# Patient Record
Sex: Female | Born: 1950 | Race: White | Hispanic: No | Marital: Single | Smoking: Former smoker
Health system: Southern US, Community
[De-identification: ages and names within clinical notes are randomized; demographics above are authoritative.]

## PROBLEM LIST (undated history)

## (undated) DIAGNOSIS — Z8619 Personal history of other infectious and parasitic diseases: Secondary | ICD-10-CM

## (undated) HISTORY — DX: Personal history of other infectious and parasitic diseases: Z86.19

---

## 2017-03-23 DIAGNOSIS — M5136 Other intervertebral disc degeneration, lumbar region: Secondary | ICD-10-CM | POA: Diagnosis not present

## 2017-03-23 DIAGNOSIS — M9903 Segmental and somatic dysfunction of lumbar region: Secondary | ICD-10-CM | POA: Diagnosis not present

## 2017-03-23 DIAGNOSIS — M9904 Segmental and somatic dysfunction of sacral region: Secondary | ICD-10-CM | POA: Diagnosis not present

## 2017-03-23 DIAGNOSIS — M545 Low back pain: Secondary | ICD-10-CM | POA: Diagnosis not present

## 2017-03-24 DIAGNOSIS — M9904 Segmental and somatic dysfunction of sacral region: Secondary | ICD-10-CM | POA: Diagnosis not present

## 2017-03-24 DIAGNOSIS — M545 Low back pain: Secondary | ICD-10-CM | POA: Diagnosis not present

## 2017-03-24 DIAGNOSIS — M5136 Other intervertebral disc degeneration, lumbar region: Secondary | ICD-10-CM | POA: Diagnosis not present

## 2017-03-24 DIAGNOSIS — M9903 Segmental and somatic dysfunction of lumbar region: Secondary | ICD-10-CM | POA: Diagnosis not present

## 2017-03-25 DIAGNOSIS — M545 Low back pain: Secondary | ICD-10-CM | POA: Diagnosis not present

## 2017-03-25 DIAGNOSIS — M9903 Segmental and somatic dysfunction of lumbar region: Secondary | ICD-10-CM | POA: Diagnosis not present

## 2017-03-25 DIAGNOSIS — M9904 Segmental and somatic dysfunction of sacral region: Secondary | ICD-10-CM | POA: Diagnosis not present

## 2017-03-25 DIAGNOSIS — M5136 Other intervertebral disc degeneration, lumbar region: Secondary | ICD-10-CM | POA: Diagnosis not present

## 2017-03-30 DIAGNOSIS — M5136 Other intervertebral disc degeneration, lumbar region: Secondary | ICD-10-CM | POA: Diagnosis not present

## 2017-03-30 DIAGNOSIS — M9904 Segmental and somatic dysfunction of sacral region: Secondary | ICD-10-CM | POA: Diagnosis not present

## 2017-03-30 DIAGNOSIS — M9903 Segmental and somatic dysfunction of lumbar region: Secondary | ICD-10-CM | POA: Diagnosis not present

## 2017-03-30 DIAGNOSIS — M545 Low back pain: Secondary | ICD-10-CM | POA: Diagnosis not present

## 2017-03-31 DIAGNOSIS — M5136 Other intervertebral disc degeneration, lumbar region: Secondary | ICD-10-CM | POA: Diagnosis not present

## 2017-03-31 DIAGNOSIS — M545 Low back pain: Secondary | ICD-10-CM | POA: Diagnosis not present

## 2017-03-31 DIAGNOSIS — M9904 Segmental and somatic dysfunction of sacral region: Secondary | ICD-10-CM | POA: Diagnosis not present

## 2017-03-31 DIAGNOSIS — M9903 Segmental and somatic dysfunction of lumbar region: Secondary | ICD-10-CM | POA: Diagnosis not present

## 2017-04-01 DIAGNOSIS — M9903 Segmental and somatic dysfunction of lumbar region: Secondary | ICD-10-CM | POA: Diagnosis not present

## 2017-04-01 DIAGNOSIS — M5136 Other intervertebral disc degeneration, lumbar region: Secondary | ICD-10-CM | POA: Diagnosis not present

## 2017-04-01 DIAGNOSIS — M545 Low back pain: Secondary | ICD-10-CM | POA: Diagnosis not present

## 2017-04-01 DIAGNOSIS — M9904 Segmental and somatic dysfunction of sacral region: Secondary | ICD-10-CM | POA: Diagnosis not present

## 2017-04-06 DIAGNOSIS — M9904 Segmental and somatic dysfunction of sacral region: Secondary | ICD-10-CM | POA: Diagnosis not present

## 2017-04-06 DIAGNOSIS — M545 Low back pain: Secondary | ICD-10-CM | POA: Diagnosis not present

## 2017-04-06 DIAGNOSIS — M5136 Other intervertebral disc degeneration, lumbar region: Secondary | ICD-10-CM | POA: Diagnosis not present

## 2017-04-06 DIAGNOSIS — M9903 Segmental and somatic dysfunction of lumbar region: Secondary | ICD-10-CM | POA: Diagnosis not present

## 2017-04-08 DIAGNOSIS — M9903 Segmental and somatic dysfunction of lumbar region: Secondary | ICD-10-CM | POA: Diagnosis not present

## 2017-04-08 DIAGNOSIS — M5136 Other intervertebral disc degeneration, lumbar region: Secondary | ICD-10-CM | POA: Diagnosis not present

## 2017-04-08 DIAGNOSIS — M9904 Segmental and somatic dysfunction of sacral region: Secondary | ICD-10-CM | POA: Diagnosis not present

## 2017-04-08 DIAGNOSIS — M545 Low back pain: Secondary | ICD-10-CM | POA: Diagnosis not present

## 2017-04-12 DIAGNOSIS — M9903 Segmental and somatic dysfunction of lumbar region: Secondary | ICD-10-CM | POA: Diagnosis not present

## 2017-04-12 DIAGNOSIS — M5136 Other intervertebral disc degeneration, lumbar region: Secondary | ICD-10-CM | POA: Diagnosis not present

## 2017-04-12 DIAGNOSIS — M9904 Segmental and somatic dysfunction of sacral region: Secondary | ICD-10-CM | POA: Diagnosis not present

## 2017-04-12 DIAGNOSIS — M545 Low back pain: Secondary | ICD-10-CM | POA: Diagnosis not present

## 2017-04-13 DIAGNOSIS — M9904 Segmental and somatic dysfunction of sacral region: Secondary | ICD-10-CM | POA: Diagnosis not present

## 2017-04-13 DIAGNOSIS — M545 Low back pain: Secondary | ICD-10-CM | POA: Diagnosis not present

## 2017-04-13 DIAGNOSIS — M9903 Segmental and somatic dysfunction of lumbar region: Secondary | ICD-10-CM | POA: Diagnosis not present

## 2017-04-13 DIAGNOSIS — M5136 Other intervertebral disc degeneration, lumbar region: Secondary | ICD-10-CM | POA: Diagnosis not present

## 2017-04-20 DIAGNOSIS — M545 Low back pain: Secondary | ICD-10-CM | POA: Diagnosis not present

## 2017-04-20 DIAGNOSIS — M9903 Segmental and somatic dysfunction of lumbar region: Secondary | ICD-10-CM | POA: Diagnosis not present

## 2017-04-20 DIAGNOSIS — M5136 Other intervertebral disc degeneration, lumbar region: Secondary | ICD-10-CM | POA: Diagnosis not present

## 2017-04-20 DIAGNOSIS — M9904 Segmental and somatic dysfunction of sacral region: Secondary | ICD-10-CM | POA: Diagnosis not present

## 2017-07-07 DIAGNOSIS — M1611 Unilateral primary osteoarthritis, right hip: Secondary | ICD-10-CM | POA: Diagnosis not present

## 2017-07-07 DIAGNOSIS — M1612 Unilateral primary osteoarthritis, left hip: Secondary | ICD-10-CM | POA: Diagnosis not present

## 2017-07-08 DIAGNOSIS — M1611 Unilateral primary osteoarthritis, right hip: Secondary | ICD-10-CM | POA: Diagnosis not present

## 2017-07-08 DIAGNOSIS — M25551 Pain in right hip: Secondary | ICD-10-CM | POA: Diagnosis not present

## 2017-07-08 DIAGNOSIS — M1612 Unilateral primary osteoarthritis, left hip: Secondary | ICD-10-CM | POA: Diagnosis not present

## 2017-07-08 DIAGNOSIS — M25552 Pain in left hip: Secondary | ICD-10-CM | POA: Diagnosis not present

## 2017-07-11 DIAGNOSIS — M25551 Pain in right hip: Secondary | ICD-10-CM | POA: Diagnosis not present

## 2017-07-11 DIAGNOSIS — M25552 Pain in left hip: Secondary | ICD-10-CM | POA: Diagnosis not present

## 2017-07-11 DIAGNOSIS — M1611 Unilateral primary osteoarthritis, right hip: Secondary | ICD-10-CM | POA: Diagnosis not present

## 2017-07-11 DIAGNOSIS — M1612 Unilateral primary osteoarthritis, left hip: Secondary | ICD-10-CM | POA: Diagnosis not present

## 2017-07-14 DIAGNOSIS — M25551 Pain in right hip: Secondary | ICD-10-CM | POA: Diagnosis not present

## 2017-07-14 DIAGNOSIS — M1612 Unilateral primary osteoarthritis, left hip: Secondary | ICD-10-CM | POA: Diagnosis not present

## 2017-07-14 DIAGNOSIS — M1611 Unilateral primary osteoarthritis, right hip: Secondary | ICD-10-CM | POA: Diagnosis not present

## 2017-07-14 DIAGNOSIS — M25552 Pain in left hip: Secondary | ICD-10-CM | POA: Diagnosis not present

## 2017-07-18 ENCOUNTER — Ambulatory Visit (INDEPENDENT_AMBULATORY_CARE_PROVIDER_SITE_OTHER): Payer: Medicare Other | Admitting: Internal Medicine

## 2017-07-18 ENCOUNTER — Other Ambulatory Visit (INDEPENDENT_AMBULATORY_CARE_PROVIDER_SITE_OTHER): Payer: Medicare Other

## 2017-07-18 ENCOUNTER — Encounter: Payer: Self-pay | Admitting: Internal Medicine

## 2017-07-18 VITALS — BP 130/90 | HR 84 | Temp 98.1°F | Resp 16 | Ht 69.0 in | Wt 161.0 lb

## 2017-07-18 DIAGNOSIS — Z1211 Encounter for screening for malignant neoplasm of colon: Secondary | ICD-10-CM

## 2017-07-18 DIAGNOSIS — R059 Cough, unspecified: Secondary | ICD-10-CM

## 2017-07-18 DIAGNOSIS — Z1159 Encounter for screening for other viral diseases: Secondary | ICD-10-CM | POA: Insufficient documentation

## 2017-07-18 DIAGNOSIS — I10 Essential (primary) hypertension: Secondary | ICD-10-CM

## 2017-07-18 DIAGNOSIS — E785 Hyperlipidemia, unspecified: Secondary | ICD-10-CM | POA: Diagnosis not present

## 2017-07-18 DIAGNOSIS — R05 Cough: Secondary | ICD-10-CM | POA: Diagnosis not present

## 2017-07-18 DIAGNOSIS — Z1231 Encounter for screening mammogram for malignant neoplasm of breast: Secondary | ICD-10-CM

## 2017-07-18 DIAGNOSIS — N6341 Unspecified lump in right breast, subareolar: Secondary | ICD-10-CM

## 2017-07-18 LAB — COMPREHENSIVE METABOLIC PANEL
ALBUMIN: 4.5 g/dL (ref 3.5–5.2)
ALT: 18 U/L (ref 0–35)
AST: 11 U/L (ref 0–37)
Alkaline Phosphatase: 102 U/L (ref 39–117)
BILIRUBIN TOTAL: 0.3 mg/dL (ref 0.2–1.2)
BUN: 19 mg/dL (ref 6–23)
CALCIUM: 10.6 mg/dL — AB (ref 8.4–10.5)
CO2: 29 mEq/L (ref 19–32)
CREATININE: 0.57 mg/dL (ref 0.40–1.20)
Chloride: 101 mEq/L (ref 96–112)
GFR: 112.31 mL/min (ref >60.00)
Glucose, Bld: 92 mg/dL (ref 70–99)
Potassium: 3.8 mEq/L (ref 3.5–5.1)
Sodium: 138 mEq/L (ref 135–145)
Total Protein: 7.7 g/dL (ref 6.0–8.3)

## 2017-07-18 LAB — LIPID PANEL
CHOLESTEROL: 293 mg/dL — AB (ref 0–200)
HDL: 60.4 mg/dL (ref >39.00)
LDL CALC: 201 mg/dL — AB (ref 0–99)
NonHDL: 232.48
TRIGLYCERIDES: 156 mg/dL — AB (ref 0.0–149.0)
Total CHOL/HDL Ratio: 5
VLDL: 31.2 mg/dL (ref 0.0–40.0)

## 2017-07-18 LAB — URINALYSIS, ROUTINE W REFLEX MICROSCOPIC
Bilirubin Urine: NEGATIVE
Ketones, ur: NEGATIVE
NITRITE: NEGATIVE
SPECIFIC GRAVITY, URINE: 1.02 (ref 1.000–1.030)
Total Protein, Urine: NEGATIVE
URINE GLUCOSE: NEGATIVE
Urobilinogen, UA: 0.2 (ref 0.0–1.0)
pH: 5.5 (ref 5.0–8.0)

## 2017-07-18 LAB — CBC WITH DIFFERENTIAL/PLATELET
BASOS PCT: 0.8 % (ref 0.0–3.0)
Basophils Absolute: 0.1 10*3/uL (ref 0.0–0.1)
EOS ABS: 0.2 10*3/uL (ref 0.0–0.7)
Eosinophils Relative: 2.5 % (ref 0.0–5.0)
HCT: 44.1 % (ref 36.0–46.0)
HEMOGLOBIN: 15.1 g/dL — AB (ref 12.0–15.0)
Lymphocytes Relative: 35.4 % (ref 12.0–46.0)
Lymphs Abs: 3.2 10*3/uL (ref 0.7–4.0)
MCHC: 34.3 g/dL (ref 30.0–36.0)
MCV: 90.6 fl (ref 78.0–100.0)
MONOS PCT: 6.3 % (ref 3.0–12.0)
Monocytes Absolute: 0.6 10*3/uL (ref 0.1–1.0)
NEUTROS ABS: 5 10*3/uL (ref 1.4–7.7)
Neutrophils Relative %: 55 % (ref 43.0–77.0)
Platelets: 329 10*3/uL (ref 150.0–400.0)
RBC: 4.87 Mil/uL (ref 3.87–5.11)
RDW: 14.1 % (ref 11.5–15.5)
WBC: 9.2 10*3/uL (ref 4.0–10.5)

## 2017-07-18 NOTE — Progress Notes (Signed)
Subjective:  Patient ID: Hailey Rodriguez, female    DOB: [birth year]  Age: 66 y.o. MRN: [id number]  CC: Cough  NEW TO ME  HPI Hailey Rodriguez presents for establishing as a new patient.  [place].  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  She has been seeing an orthopedist and is doing physical therapy.  He uses a 4 pronged walker and a cane to help with ambulation.  She also complains that she has had a mild, intermittent nonproductive cough for about 3 months.  Hailey Rodriguez has a past medical Hailey of Hailey of chicken pox.   She has no past surgical Hailey on file.   Her family Hailey is not on file.She reports that she quit smoking about 39 years ago. Her smoking use included cigarettes. she has never used smokeless tobacco. She reports that she drinks about 0.6 oz of alcohol per week. She reports that she does not use drugs.  Outpatient Medications Prior to Visit  Medication Sig Dispense Refill  . vitamin C (ASCORBIC ACID) 500 MG tablet Take 500 mg by mouth daily.     No facility-administered medications prior to visit.     ROS Review of Systems  Constitutional: Negative.  Negative for appetite change, diaphoresis, fatigue and unexpected weight change.  HENT: Negative.  Negative for sinus pressure, trouble swallowing and voice change.   Eyes: Negative.   Respiratory: Positive for cough. Negative for chest tightness, shortness of breath and wheezing.   Cardiovascular: Negative for chest pain, palpitations and leg swelling.  Gastrointestinal: Negative for abdominal pain, constipation, diarrhea, nausea and vomiting.  Endocrine: Negative.   Genitourinary: Negative.  Negative for difficulty urinating, dysuria, frequency and hematuria.  Musculoskeletal: Positive for arthralgias. Negative for back pain, myalgias and neck pain.  Skin: Negative.   Negative for color change and pallor.       She complains of a several month Hailey of red, slightly painful, growing lump around her right nipple.  She thought it was a fungal infection that she caught at the beach.  Allergic/Immunologic: Negative.   Neurological: Negative.  Negative for dizziness, weakness, light-headedness, numbness and headaches.  Hematological: Negative for adenopathy. Does not bruise/bleed easily.  Psychiatric/Behavioral: Negative.     Objective:  BP 130/90 (BP Location: Left Arm, Patient Position: Sitting, Cuff Size: Normal)   Pulse 84   Temp 98.1 F (36.7 C) (Oral)   Resp 16   Wt 161 lb (73 kg)   HC 16" (40.6 cm)   SpO2 98%   Physical Exam  Constitutional: She is oriented to person, place, and time. No distress.  HENT:  Mouth/Throat: Oropharynx is clear and moist. No oropharyngeal exudate.  Eyes: Conjunctivae are normal. Left eye exhibits no discharge. No scleral icterus.  Neck: Normal range of motion. Neck supple. No JVD present. No thyromegaly present.  Cardiovascular: Normal rate, regular rhythm and normal heart sounds. Exam reveals no gallop and no friction rub.  No murmur heard. Pulmonary/Chest: Effort normal and breath sounds normal. No stridor. No respiratory distress. She has no wheezes. She has no rales.    Abdominal: Soft. Bowel sounds are normal. She exhibits no distension and no mass. There is no tenderness. There is no guarding.  Musculoskeletal: Normal range of motion. She exhibits no edema, tenderness or deformity.  Lymphadenopathy:    She has no cervical adenopathy.  Neurological: She is alert and oriented to person, place, and time.  Skin: Skin is warm and dry. No rash noted. She is not diaphoretic. No erythema. No pallor.  Psychiatric: She has a normal mood and affect. Her behavior is normal. Judgment and thought content normal.  Vitals reviewed.   Lab Results  Component Value Date   WBC 9.2 07/18/2017   HGB 15.1 (H) 07/18/2017    HCT 44.1 07/18/2017   PLT 329.0 07/18/2017   GLUCOSE 92 07/18/2017   CHOL 293 (H) 07/18/2017   TRIG 156.0 (H) 07/18/2017   HDL 60.40 07/18/2017   LDLCALC 201 (H) 07/18/2017   ALT 18 07/18/2017   AST 11 07/18/2017   NA 138 07/18/2017   K 3.8 07/18/2017   CL 101 07/18/2017   CREATININE 0.57 07/18/2017   BUN 19 07/18/2017   CO2 29 07/18/2017   TSH 0.85 07/18/2017    Assessment & Plan:   Hailey Rodriguez was seen today for cough.  Diagnoses and all orders for this visit:  Essential hypertension- She has stage I hypertension but defers on starting an antihypertensive at this time.  Her lab work is only remarkable for mild hypercalcemia otherwise there is no evidence of secondary causes or endorgan damage. -     Comprehensive metabolic panel; Future -     CBC with Differential/Platelet; Future -     Urinalysis, Routine w reflex microscopic; Future  Hyperlipidemia LDL goal <130- She has a mildly elevated LDL but her ASCVD risk score is not significantly elevated so at this time I do not recommend a statin for CV risk reduction. -     Comprehensive metabolic panel; Future -     Lipid panel; Future -     Thyroid Panel With TSH; Future  Visit for screening mammogram -     MM DIGITAL SCREENING BILATERAL; Future  Screen for colon cancer -     Ambulatory referral to Gastroenterology  Need for hepatitis C screening test -     Hepatitis C antibody; Future  Subareolar mass of right breast- I have asked her to see general surgery to consider having this biopsied. -     Ambulatory referral to General Surgery  Cough- I will check her chest x-ray for mass or infiltrate. -     DG Chest 2 View; Future   I am having Hailey Rodriguez maintain her vitamin C.  No orders of the defined types were placed in this encounter.    Follow-up: Return in about 3 months (around 10/15/2017).  Scarlette Calico, MD

## 2017-07-18 NOTE — Patient Instructions (Signed)

## 2017-07-19 ENCOUNTER — Encounter: Payer: Self-pay | Admitting: Internal Medicine

## 2017-07-19 ENCOUNTER — Telehealth: Payer: Self-pay | Admitting: Internal Medicine

## 2017-07-19 ENCOUNTER — Telehealth: Payer: Self-pay

## 2017-07-19 ENCOUNTER — Ambulatory Visit (INDEPENDENT_AMBULATORY_CARE_PROVIDER_SITE_OTHER)
Admission: RE | Admit: 2017-07-19 | Discharge: 2017-07-19 | Disposition: A | Discharge status: Home / Self Care | Payer: Medicare Other | Source: Ambulatory Visit | Attending: Internal Medicine | Admitting: Internal Medicine

## 2017-07-19 DIAGNOSIS — N6341 Unspecified lump in right breast, subareolar: Secondary | ICD-10-CM | POA: Insufficient documentation

## 2017-07-19 DIAGNOSIS — R059 Cough, unspecified: Secondary | ICD-10-CM

## 2017-07-19 DIAGNOSIS — M25552 Pain in left hip: Secondary | ICD-10-CM | POA: Diagnosis not present

## 2017-07-19 DIAGNOSIS — M1612 Unilateral primary osteoarthritis, left hip: Secondary | ICD-10-CM | POA: Diagnosis not present

## 2017-07-19 DIAGNOSIS — R05 Cough: Secondary | ICD-10-CM

## 2017-07-19 DIAGNOSIS — E2839 Other primary ovarian failure: Secondary | ICD-10-CM

## 2017-07-19 DIAGNOSIS — M25551 Pain in right hip: Secondary | ICD-10-CM | POA: Diagnosis not present

## 2017-07-19 DIAGNOSIS — M1611 Unilateral primary osteoarthritis, right hip: Secondary | ICD-10-CM | POA: Diagnosis not present

## 2017-07-19 LAB — HEPATITIS C ANTIBODY
Hepatitis C Ab: NONREACTIVE
SIGNAL TO CUT-OFF: 0.01 (ref <1.00)

## 2017-07-19 LAB — THYROID PANEL WITH TSH
FREE THYROXINE INDEX: 2.6 (ref 1.4–3.8)
T3 UPTAKE: 27 % (ref 22–35)
T4 TOTAL: 9.6 ug/dL (ref 5.1–11.9)
TSH: 0.85 mIU/L (ref 0.40–4.50)

## 2017-07-19 LAB — HM DEXA SCAN: HM DEXA SCAN: -2.4

## 2017-07-19 NOTE — Telephone Encounter (Signed)
Error

## 2017-07-19 NOTE — Telephone Encounter (Signed)
Copied from [place]. Topic: General - Other >> Jul 19, 2017  4:37 PM Cecelia Byars, NT wrote: Reason for CRM: Patient called and would like  penicillin in the liquid form called in to [place], [facility] [place]. [phone] (Phone) [phone] (Fax)

## 2017-07-21 ENCOUNTER — Ambulatory Visit: Payer: Self-pay | Admitting: *Deleted

## 2017-07-21 DIAGNOSIS — M1612 Unilateral primary osteoarthritis, left hip: Secondary | ICD-10-CM | POA: Diagnosis not present

## 2017-07-21 DIAGNOSIS — M1611 Unilateral primary osteoarthritis, right hip: Secondary | ICD-10-CM | POA: Diagnosis not present

## 2017-07-21 DIAGNOSIS — M25551 Pain in right hip: Secondary | ICD-10-CM | POA: Diagnosis not present

## 2017-07-21 DIAGNOSIS — M25552 Pain in left hip: Secondary | ICD-10-CM | POA: Diagnosis not present

## 2017-07-21 NOTE — Telephone Encounter (Signed)
Pt had a question regarding needing penicillin for her chronic bronchitis. Pt denies any respiratory symptoms. Denies fever. Has mild cough.  Pt was triaged. She wants to know if she needs liquid  penicillin . Advised to call back if symptoms occur of she feels worst. Pt voiced understanding.

## 2017-07-21 NOTE — Telephone Encounter (Signed)
Pt had a question regarding needing penicillin for her chronic bronchitis. Pt denies any respiratory symptoms. Denies fever. Has mild cough.  Will send message to office, per protocol. Advised to call back if symptoms occur of she feels worst. Pt voiced understanding.  Reason for Disposition . Caller requesting a NON-URGENT new prescription or refill and triager unable to refill per unit policy  Answer Assessment - Initial Assessment Questions 1. SYMPTOMS: "Do you have any symptoms?"     Cough 2. SEVERITY: If symptoms are present, ask "Are they mild, moderate or severe?"     mild  Protocols used: MEDICATION QUESTION CALL-A-AH

## 2017-07-21 NOTE — Telephone Encounter (Signed)
Called patient to see what symptoms she maybe having to clarify the request for liquid penicillin. No answer. Left message to call us back.

## 2017-07-25 DIAGNOSIS — M1612 Unilateral primary osteoarthritis, left hip: Secondary | ICD-10-CM | POA: Diagnosis not present

## 2017-07-25 DIAGNOSIS — M25552 Pain in left hip: Secondary | ICD-10-CM | POA: Diagnosis not present

## 2017-07-25 DIAGNOSIS — M25551 Pain in right hip: Secondary | ICD-10-CM | POA: Diagnosis not present

## 2017-07-25 DIAGNOSIS — M1611 Unilateral primary osteoarthritis, right hip: Secondary | ICD-10-CM | POA: Diagnosis not present

## 2017-07-28 DIAGNOSIS — M1612 Unilateral primary osteoarthritis, left hip: Secondary | ICD-10-CM | POA: Diagnosis not present

## 2017-07-28 DIAGNOSIS — M25551 Pain in right hip: Secondary | ICD-10-CM | POA: Diagnosis not present

## 2017-07-28 DIAGNOSIS — M1611 Unilateral primary osteoarthritis, right hip: Secondary | ICD-10-CM | POA: Diagnosis not present

## 2017-07-28 DIAGNOSIS — M25552 Pain in left hip: Secondary | ICD-10-CM | POA: Diagnosis not present

## 2017-08-01 DIAGNOSIS — M1612 Unilateral primary osteoarthritis, left hip: Secondary | ICD-10-CM | POA: Diagnosis not present

## 2017-08-01 DIAGNOSIS — M25552 Pain in left hip: Secondary | ICD-10-CM | POA: Diagnosis not present

## 2017-08-01 DIAGNOSIS — M1611 Unilateral primary osteoarthritis, right hip: Secondary | ICD-10-CM | POA: Diagnosis not present

## 2017-08-01 DIAGNOSIS — M25551 Pain in right hip: Secondary | ICD-10-CM | POA: Diagnosis not present

## 2017-08-04 DIAGNOSIS — M1611 Unilateral primary osteoarthritis, right hip: Secondary | ICD-10-CM | POA: Diagnosis not present

## 2017-08-04 DIAGNOSIS — M1612 Unilateral primary osteoarthritis, left hip: Secondary | ICD-10-CM | POA: Diagnosis not present

## 2017-08-04 DIAGNOSIS — M25551 Pain in right hip: Secondary | ICD-10-CM | POA: Diagnosis not present

## 2017-08-04 DIAGNOSIS — M25552 Pain in left hip: Secondary | ICD-10-CM | POA: Diagnosis not present

## 2017-08-16 ENCOUNTER — Encounter: Payer: Self-pay | Admitting: Internal Medicine

## 2017-08-26 DIAGNOSIS — M1611 Unilateral primary osteoarthritis, right hip: Secondary | ICD-10-CM | POA: Diagnosis not present

## 2017-08-26 DIAGNOSIS — M25552 Pain in left hip: Secondary | ICD-10-CM | POA: Diagnosis not present

## 2017-08-26 DIAGNOSIS — M25551 Pain in right hip: Secondary | ICD-10-CM | POA: Diagnosis not present

## 2017-08-26 DIAGNOSIS — M1612 Unilateral primary osteoarthritis, left hip: Secondary | ICD-10-CM | POA: Diagnosis not present

## 2017-10-18 ENCOUNTER — Other Ambulatory Visit (INDEPENDENT_AMBULATORY_CARE_PROVIDER_SITE_OTHER): Payer: Medicare Other

## 2017-10-18 ENCOUNTER — Encounter: Payer: Self-pay | Admitting: Internal Medicine

## 2017-10-18 ENCOUNTER — Ambulatory Visit (INDEPENDENT_AMBULATORY_CARE_PROVIDER_SITE_OTHER): Payer: Medicare Other | Admitting: Internal Medicine

## 2017-10-18 VITALS — BP 138/96 | HR 79 | Temp 99.0°F | Resp 16 | Ht 69.0 in | Wt 153.5 lb

## 2017-10-18 DIAGNOSIS — Z23 Encounter for immunization: Secondary | ICD-10-CM | POA: Diagnosis not present

## 2017-10-18 DIAGNOSIS — I10 Essential (primary) hypertension: Secondary | ICD-10-CM | POA: Diagnosis not present

## 2017-10-18 DIAGNOSIS — N6341 Unspecified lump in right breast, subareolar: Secondary | ICD-10-CM

## 2017-10-18 DIAGNOSIS — E559 Vitamin D deficiency, unspecified: Secondary | ICD-10-CM

## 2017-10-18 LAB — VITAMIN D 25 HYDROXY (VIT D DEFICIENCY, FRACTURES): VITD: 28.51 ng/mL — ABNORMAL LOW (ref 30.00–100.00)

## 2017-10-18 NOTE — Progress Notes (Signed)
Subjective:  Patient ID: Hailey Rodriguez, female    DOB: Oct 03, 1950  Age: 67 y.o. MRN: [id number]  CC: Hypertension   HPI Hailey Rodriguez presents for f/up - She has not seen a surgeon about the right breast mass.  Said she has been sick with a cold for a couple of weeks.  She tells me her cold symptoms have resolved.  She feels well today and offers no complaints.  Outpatient Medications Prior to Visit  Medication Sig Dispense Refill  . vitamin C (ASCORBIC ACID) 500 MG tablet Take 500 mg by mouth daily.     No facility-administered medications prior to visit.     ROS Review of Systems  Constitutional: Negative for chills, fatigue and fever.  HENT: Negative.  Negative for sinus pressure and sore throat.   Eyes: Negative for visual disturbance.  Respiratory: Negative for cough, chest tightness, shortness of breath and wheezing.   Cardiovascular: Negative for chest pain, palpitations and leg swelling.  Gastrointestinal: Negative for abdominal pain, constipation, diarrhea, nausea and vomiting.  Endocrine: Negative.   Genitourinary: Negative.  Negative for difficulty urinating.  Musculoskeletal: Negative.  Negative for arthralgias and myalgias.  Skin: Negative.  Negative for color change, pallor and rash.  Allergic/Immunologic: Negative.   Neurological: Negative.  Negative for dizziness, weakness and headaches.  Hematological: Negative for adenopathy. Does not bruise/bleed easily.  Psychiatric/Behavioral: Negative.  Negative for sleep disturbance. The patient is not nervous/anxious.     Objective:  BP (!) 138/96 (BP Location: Left Arm, Patient Position: Sitting, Cuff Size: Normal)   Pulse 79   Temp 99 F (37.2 C) (Oral)   Resp 16   Ht 5\' 9"  (1.753 m)   Wt 153 lb 8 oz (69.6 kg)   SpO2 96%   BMI 22.67 kg/m   BP Readings from Last 3 Encounters:  10/18/17 (!) 138/96  07/18/17 130/90    Wt Readings from Last 3 Encounters:  10/18/17 153 lb 8 oz (69.6 kg)  07/18/17 161 lb (73  kg)    Physical Exam  Constitutional: She is oriented to person, place, and time. No distress.  HENT:  Mouth/Throat: Oropharynx is clear and moist. No oropharyngeal exudate.  Eyes: Conjunctivae are normal. No scleral icterus.  Neck: Normal range of motion. Neck supple. No JVD present. No tracheal deviation present. No thyromegaly present.  Cardiovascular: Normal rate and regular rhythm. Exam reveals no friction rub.  No murmur heard. Pulmonary/Chest: Effort normal and breath sounds normal. She has no wheezes. She has no rales.  Abdominal: Soft. Bowel sounds are normal. She exhibits no mass. There is no hepatosplenomegaly. There is no tenderness.  Musculoskeletal: Normal range of motion. She exhibits no edema, tenderness or deformity.  Lymphadenopathy:    She has no cervical adenopathy.  Neurological: She is alert and oriented to person, place, and time.  Skin: Skin is warm and dry. She is not diaphoretic.  Psychiatric: She has a normal mood and affect. Her behavior is normal. Thought content normal.  Vitals reviewed.   Lab Results  Component Value Date   WBC 9.2 07/18/2017   HGB 15.1 (H) 07/18/2017   HCT 44.1 07/18/2017   PLT 329.0 07/18/2017   GLUCOSE 92 07/18/2017   CHOL 293 (H) 07/18/2017   TRIG 156.0 (H) 07/18/2017   HDL 60.40 07/18/2017   LDLCALC 201 (H) 07/18/2017   ALT 18 07/18/2017   AST 11 07/18/2017   NA 138 07/18/2017   K 3.8 07/18/2017   CL 101 07/18/2017  CREATININE 0.57 07/18/2017   BUN 19 07/18/2017   CO2 29 07/18/2017   TSH 0.85 07/18/2017    Dg Chest 2 View  Result Date: 07/19/2017 CLINICAL DATA:  Chronic cough, former smoker, hyperlipidemia. Routine physical examination. EXAM: CHEST  2 VIEW COMPARISON:  None in PACs FINDINGS: The lungs are mildly hyperinflated with hemidiaphragm flattening. There is no focal infiltrate. There is no pleural effusion. The heart and pulmonary vascularity are normal. The mediastinum is normal in width. The trachea is  midline. The bony thorax is unremarkable. IMPRESSION: Chronic bronchitic changes. There is no active cardiopulmonary disease. Electronically Signed   By: David  Martinique M.D.   On: 07/19/2017 09:16   Dg Bone Density  Result Date: 07/19/2017 Date of study: 07/19/2017 Exam: DUAL X-RAY ABSORPTIOMETRY (DXA) FOR BONE MINERAL DENSITY (BMD) Instrument: [employer] Provider: PCP Indication: Screening for low BMD in a patient with Legg-Calv-Perthes disease Comparison: none (please note that it is not possible to compare data from different instruments) Clinical data: Pt is a 67 y.o. female with previous history of foot fracture. Results:  Lumbar spine L1-L4 Femoral neck (FN) 33% distal radius Ultra distal radius T-score -1.4 RFN: +0.6 LFN: -0.1  -1.8  -2.4 Assessment: the BMD is low according to the [facility] classification for osteoporosis (see below). Fracture risk: moderate Comments: the technical quality of the study is good, however, the hips are abnormally rotated, which can greatly influence the bone mineral density readings. These readings should not not be used in this patient with Legg-Calv-Perthes disease.  The 33% distal radius can be used as a surrogate for cortical bone, and the ultra distal radius can be used as a surrogate for trabecular bone. Evaluation for secondary causes should be considered if clinically indicated. Recommend optimizing calcium (1200 mg/day) and vitamin D (800 IU/day) intake. Followup: Repeat BMD is appropriate after 2 years or after 1-2 years if starting treatment. WHO criteria for diagnosis of osteoporosis in postmenopausal women and in men 9 y/o or older: - normal: T-score -1.0 to + 1.0 - osteopenia/low bone density: T-score between -2.5 and -1.0 - osteoporosis: T-score below -2.5 - severe osteoporosis: T-score below -2.5 with history of fragility fracture Note: although not part of the WHO classification, the presence of a fragility fracture, regardless of the  T-score, should be considered diagnostic of osteoporosis, provided other causes for the fracture have been excluded. Treatment: The National Osteoporosis Foundation recommends that treatment be considered in postmenopausal women and men age 66 or older with: 1. Hip or vertebral (clinical or morphometric) fracture 2. T-score of - 2.5 or lower at the spine or hip 3. 10-year fracture probability by FRAX of at least 20% for a major osteoporotic fracture and 3% for a hip fracture Philemon Kingdom, MD [facility]    Assessment & Plan:   Hailey Rodriguez was seen today for hypertension.  Diagnoses and all orders for this visit:  Essential hypertension- Her blood pressure is not adequately well controlled.  She is not willing to take an antihypertensive.  I will treat the vitamin D deficiency. -     VITAMIN D 25 Hydroxy (Vit-D Deficiency, Fractures); Future  Hypercalcemia- Her calcium level is normal now.  Her PTH is mildly elevated at 70.  She may be developing a secondary hyperparathyroidism due to the vitamin D deficiency so I have asked her to start taking a vitamin D supplement. -     VITAMIN D 25 Hydroxy (Vit-D Deficiency, Fractures); Future -     PTH,  intact and calcium; Future  Subareolar mass of right breast -     Ambulatory referral to General Surgery  Need for Tdap vaccination -     Tdap vaccine greater than or equal to 7yo IM  Need for pneumococcal vaccination -     Pneumococcal conjugate vaccine 13-valent  Vitamin D deficiency -     Cholecalciferol 2000 units TABS; Take 1 tablet (2,000 Units total) by mouth daily.   I am having Kieu L. Chatwin start on Cholecalciferol. I am also having her maintain her vitamin C.  Meds ordered this encounter  Medications  . Cholecalciferol 2000 units TABS    Sig: Take 1 tablet (2,000 Units total) by mouth daily.    Dispense:  90 tablet    Refill:  1     Follow-up: Return in about 3 months (around 01/18/2018).  Scarlette Calico, MD

## 2017-10-18 NOTE — Patient Instructions (Signed)

## 2017-10-20 DIAGNOSIS — E559 Vitamin D deficiency, unspecified: Secondary | ICD-10-CM | POA: Insufficient documentation

## 2017-10-20 DIAGNOSIS — Z23 Encounter for immunization: Secondary | ICD-10-CM | POA: Insufficient documentation

## 2017-10-20 LAB — PTH, INTACT AND CALCIUM
Calcium: 9.9 mg/dL (ref 8.6–10.4)
PTH: 70 pg/mL — ABNORMAL HIGH (ref 14–64)

## 2017-10-20 MED ORDER — CHOLECALCIFEROL 50 MCG (2000 UT) PO TABS: 1.0000 | ORAL_TABLET | Freq: Every day | ORAL | 1 refills | Status: AC

## 2018-01-18 ENCOUNTER — Ambulatory Visit (INDEPENDENT_AMBULATORY_CARE_PROVIDER_SITE_OTHER): Payer: Medicare Other | Admitting: Internal Medicine

## 2018-01-18 ENCOUNTER — Encounter: Payer: Self-pay | Admitting: Internal Medicine

## 2018-01-18 VITALS — BP 130/90 | HR 75 | Temp 97.4°F | Ht 69.0 in | Wt 150.5 lb

## 2018-01-18 DIAGNOSIS — N6341 Unspecified lump in right breast, subareolar: Secondary | ICD-10-CM | POA: Diagnosis not present

## 2018-01-18 DIAGNOSIS — I10 Essential (primary) hypertension: Secondary | ICD-10-CM

## 2018-01-18 NOTE — Progress Notes (Signed)
Subjective:  Patient ID: Hailey Rodriguez, female    DOB: 03-23-1951  Age: 67 y.o. MRN: [id number]  CC: Hypertension   HPI Hailey Rodriguez presents for f/up - She continues to complain about her right breast.  When I saw her 3 months ago I referred her to general surgery but she elected not to see a surgeon yet.  She is petrified about being diagnosed with breast cancer and is concerned about the costs.  She complains that the lesion is still there and now is starting to drain blood.  She cleans it daily with hydrogen peroxide and puts a Band-Aid over it.  Outpatient Medications Prior to Visit  Medication Sig Dispense Refill  . Cholecalciferol 2000 units TABS Take 1 tablet (2,000 Units total) by mouth daily. 90 tablet 1  . Omega-3 Fatty Acids (OMEGA-3 FISH OIL PO) Take by mouth.    . vitamin C (ASCORBIC ACID) 500 MG tablet Take 500 mg by mouth daily.     No facility-administered medications prior to visit.     ROS Review of Systems  Constitutional: Negative for chills, diaphoresis and fatigue.  HENT: Negative.   Eyes: Negative for visual disturbance.  Respiratory: Negative for cough, chest tightness, shortness of breath and wheezing.   Cardiovascular: Negative for chest pain, palpitations and leg swelling.  Gastrointestinal: Negative for abdominal pain, constipation, diarrhea, nausea and vomiting.  Endocrine: Negative.   Genitourinary: Negative.  Negative for difficulty urinating.  Musculoskeletal: Negative.   Skin: Positive for color change.  Neurological: Negative.  Negative for dizziness, weakness and numbness.  Hematological: Negative for adenopathy. Does not bruise/bleed easily.  Psychiatric/Behavioral: Negative.     Objective:  BP 130/90 (BP Location: Left Arm, Patient Position: Sitting, Cuff Size: Normal)   Pulse 75   Temp (!) 97.4 F (36.3 C) (Oral)   Ht 5\' 9"  (1.753 m)   Wt 150 lb 8 oz (68.3 kg)   SpO2 96%   BMI 22.22 kg/m   BP Readings from Last 3 Encounters:    01/18/18 130/90  10/18/17 (!) 138/96  07/18/17 130/90    Wt Readings from Last 3 Encounters:  01/18/18 150 lb 8 oz (68.3 kg)  10/18/17 153 lb 8 oz (69.6 kg)  07/18/17 161 lb (73 kg)    Physical Exam  Constitutional: She is oriented to person, place, and time. No distress.  HENT:  Mouth/Throat: Oropharynx is clear and moist. No oropharyngeal exudate.  Eyes: Conjunctivae are normal. No scleral icterus.  Neck: Normal range of motion. Neck supple. No thyromegaly present.  Cardiovascular: Normal rate, regular rhythm and normal heart sounds. Exam reveals no gallop.  No murmur heard.   Pulmonary/Chest: Effort normal and breath sounds normal. No respiratory distress. She has no wheezes. She has no rhonchi. She has no rales.  Abdominal: Soft. Bowel sounds are normal. She exhibits no mass. There is no hepatosplenomegaly. There is no tenderness.  Musculoskeletal: Normal range of motion. She exhibits no edema, tenderness or deformity.  Neurological: She is alert and oriented to person, place, and time.  Skin: Skin is warm and dry. She is not diaphoretic. No pallor.  Vitals reviewed.   Lab Results  Component Value Date   WBC 9.2 07/18/2017   HGB 15.1 (H) 07/18/2017   HCT 44.1 07/18/2017   PLT 329.0 07/18/2017   GLUCOSE 92 07/18/2017   CHOL 293 (H) 07/18/2017   TRIG 156.0 (H) 07/18/2017   HDL 60.40 07/18/2017   LDLCALC 201 (H) 07/18/2017   ALT 18  07/18/2017   AST 11 07/18/2017   NA 138 07/18/2017   K 3.8 07/18/2017   CL 101 07/18/2017   CREATININE 0.57 07/18/2017   BUN 19 07/18/2017   CO2 29 07/18/2017   TSH 0.85 07/18/2017    Dg Chest 2 View  Result Date: 07/19/2017 CLINICAL DATA:  Chronic cough, former smoker, hyperlipidemia. Routine physical examination. EXAM: CHEST  2 VIEW COMPARISON:  None in PACs FINDINGS: The lungs are mildly hyperinflated with hemidiaphragm flattening. There is no focal infiltrate. There is no pleural effusion. The heart and pulmonary vascularity are  normal. The mediastinum is normal in width. The trachea is midline. The bony thorax is unremarkable. IMPRESSION: Chronic bronchitic changes. There is no active cardiopulmonary disease. Electronically Signed   By: David  Martinique M.D.   On: 07/19/2017 09:16   Dg Bone Density  Result Date: 07/19/2017 Date of study: 07/19/2017 Exam: DUAL X-RAY ABSORPTIOMETRY (DXA) FOR BONE MINERAL DENSITY (BMD) Instrument: [employer] Provider: PCP Indication: Screening for low BMD in a patient with Legg-Calv-Perthes disease Comparison: none (please note that it is not possible to compare data from different instruments) Clinical data: Pt is a 67 y.o. female with previous history of foot fracture. Results:  Lumbar spine L1-L4 Femoral neck (FN) 33% distal radius Ultra distal radius T-score -1.4 RFN: +0.6 LFN: -0.1  -1.8  -2.4 Assessment: the BMD is low according to the [facility] classification for osteoporosis (see below). Fracture risk: moderate Comments: the technical quality of the study is good, however, the hips are abnormally rotated, which can greatly influence the bone mineral density readings. These readings should not not be used in this patient with Legg-Calv-Perthes disease.  The 33% distal radius can be used as a surrogate for cortical bone, and the ultra distal radius can be used as a surrogate for trabecular bone. Evaluation for secondary causes should be considered if clinically indicated. Recommend optimizing calcium (1200 mg/day) and vitamin D (800 IU/day) intake. Followup: Repeat BMD is appropriate after 2 years or after 1-2 years if starting treatment. WHO criteria for diagnosis of osteoporosis in postmenopausal women and in men 60 y/o or older: - normal: T-score -1.0 to + 1.0 - osteopenia/low bone density: T-score between -2.5 and -1.0 - osteoporosis: T-score below -2.5 - severe osteoporosis: T-score below -2.5 with history of fragility fracture Note: although not part of the WHO classification, the  presence of a fragility fracture, regardless of the T-score, should be considered diagnostic of osteoporosis, provided other causes for the fracture have been excluded. Treatment: The National Osteoporosis Foundation recommends that treatment be considered in postmenopausal women and men age 68 or older with: 1. Hip or vertebral (clinical or morphometric) fracture 2. T-score of - 2.5 or lower at the spine or hip 3. 10-year fracture probability by FRAX of at least 20% for a major osteoporotic fracture and 3% for a hip fracture Philemon Kingdom, MD [facility]    Assessment & Plan:   Pearla was seen today for hypertension.  Diagnoses and all orders for this visit:  Subareolar mass of right breast- I have asked her to undergo a diagnostic mammogram and ultrasound.  I have also asked her to see a Psychologist, sport and exercise.  I have told her that it is imperative that she have this lesion biopsied and if its breast cancer to have it treated. -     Ambulatory referral to General Surgery -     US BREAST LTD UNI RIGHT INC AXILLA; Future -  MM DIAG BREAST TOMO BILATERAL; Future  Hypercalcemia- She is asymptomatic with this.  I will monitor her calcium level.  This is likely related to what appears to be breast cancer. -     Basic metabolic panel; Future -     Phosphorus; Future  Essential hypertension- Her blood pressure is well controlled.  I will monitor her lites and renal function. -     Basic metabolic panel; Future   I am having Hailey Rodriguez maintain her vitamin C, Cholecalciferol, and Omega-3 Fatty Acids (OMEGA-3 FISH OIL PO).  No orders of the defined types were placed in this encounter.    Follow-up: Return in about 3 months (around 04/20/2018).  Scarlette Calico, MD

## 2018-01-18 NOTE — Patient Instructions (Signed)
Hypercalcemia  Hypercalcemia is having too much calcium in the blood. The body needs calcium to make bones and keep them strong. Calcium also helps the muscles, nerves, brain, and heart work the way they should.  Most of the calcium in the body is in the bones. There is also some calcium in the blood. Hypercalcemia can happen when calcium comes out of the bones, or when the kidneys are not able to remove calcium from the blood. Hypercalcemia can be mild or severe.  What are the causes?  There are many possible causes of hypercalcemia. Common causes include:   Hyperparathyroidism. This is a condition in which the body produces too much parathyroid hormone. There are four parathyroid glands in your neck. These glands produce a chemical messenger (hormone) that helps the body absorb calcium from foods and helps your bones release calcium.   Certain kinds of cancer, such as lung cancer, breast cancer, or myeloma.    Less common causes of hypercalcemia include:   Getting too much calcium or vitamin D from your diet.   Kidney failure.   Hyperthyroidism.   Being on bed rest for a long time.   Certain medicines.   Infections.   Sarcoidosis.    What increases the risk?  This condition is more likely to develop in:   Women.   People who are 60 years or older.   People who have a family history of hypercalcemia.    What are the signs or symptoms?  Mild hypercalcemia that starts slowly may not cause symptoms. Severe, sudden hypercalcemia is more likely to cause symptoms, such as:   Loss of appetite.   Increased thirst and frequent urination.   Fatigue.   Nausea and vomiting.   Headache.   Abdominal pain.   Muscle pain, twitching, or weakness.   Constipation.   Blood in the urine.   Pain in the side of the back (flank pain).   Anxiety, confusion, or depression.   Irregular heartbeat (arrhythmia).   Loss of consciousness.    How is this diagnosed?  This condition may be diagnosed based on:   Your  symptoms.   Blood tests.   Urine tests.   X-rays.   Ultrasound.   MRI.   CT scan.    How is this treated?  Treatment for hypercalcemia depends on the cause. Treatment may include:   Receiving fluids through an IV tube.   Medicines that keep calcium levels steady after receiving fluids (loop diuretics).   Medicines that keep calcium in your bones (bisphosphonates).   Medicines that lower the calcium level in your blood.   Surgery to remove overactive parathyroid glands.    Follow these instructions at home:   Take over-the-counter and prescription medicines only as told by your health care provider.   Follow instructions from your health care provider about eating or drinking restrictions.   Drink enough fluid to keep your urine clear or pale yellow.   Stay active. Weight-bearing exercise helps to keep calcium in your bones. Follow instructions from your health care provider about what type and level of exercise is safe for you.   Keep all follow-up visits as told by your health care provider. This is important.  Contact a health care provider if:   You have a fever.   You have flank or abdominal pain that is getting worse.  Get help right away if:   You have severe abdominal or flank pain.   You have chest pain.     You have trouble breathing.   You become very confused and sleepy.   You lose consciousness.  This information is not intended to replace advice given to you by your health care provider. Make sure you discuss any questions you have with your health care provider.  Document Released: 07/24/2004 Document Revised: 10/16/2015 Document Reviewed: 09/25/2014  [employer] Interactive Patient Education  2018 [employer]

## 2018-01-20 ENCOUNTER — Other Ambulatory Visit (INDEPENDENT_AMBULATORY_CARE_PROVIDER_SITE_OTHER): Payer: Medicare Other

## 2018-01-20 ENCOUNTER — Encounter: Payer: Self-pay | Admitting: Internal Medicine

## 2018-01-20 DIAGNOSIS — I10 Essential (primary) hypertension: Secondary | ICD-10-CM | POA: Diagnosis not present

## 2018-01-20 LAB — BASIC METABOLIC PANEL
BUN: 26 mg/dL — AB (ref 6–23)
CALCIUM: 9.7 mg/dL (ref 8.4–10.5)
CO2: 27 mEq/L (ref 19–32)
Chloride: 101 mEq/L (ref 96–112)
Creatinine, Ser: 0.64 mg/dL (ref 0.40–1.20)
GFR: 98.11 mL/min (ref >60.00)
Glucose, Bld: 94 mg/dL (ref 70–99)
Potassium: 4.2 mEq/L (ref 3.5–5.1)
SODIUM: 138 meq/L (ref 135–145)

## 2018-01-20 LAB — PHOSPHORUS: PHOSPHORUS: 4 mg/dL (ref 2.3–4.6)

## 2018-01-31 ENCOUNTER — Encounter: Payer: Self-pay | Admitting: Internal Medicine

## 2018-02-02 ENCOUNTER — Encounter: Payer: Self-pay | Admitting: Internal Medicine

## 2018-04-26 ENCOUNTER — Ambulatory Visit: Payer: Medicare Other | Admitting: Internal Medicine

## 2018-05-31 ENCOUNTER — Ambulatory Visit: Payer: Medicare Other | Admitting: Internal Medicine

## 2018-06-09 IMAGING — DX DG CHEST 2V
2 series · 2 of 2 positions shown · non-contrast
Comparison: None in PACs

CLINICAL DATA: Chronic cough, former smoker, hyperlipidemia.
Routine physical examination.

EXAM:
CHEST  2 VIEW

[chest pa]
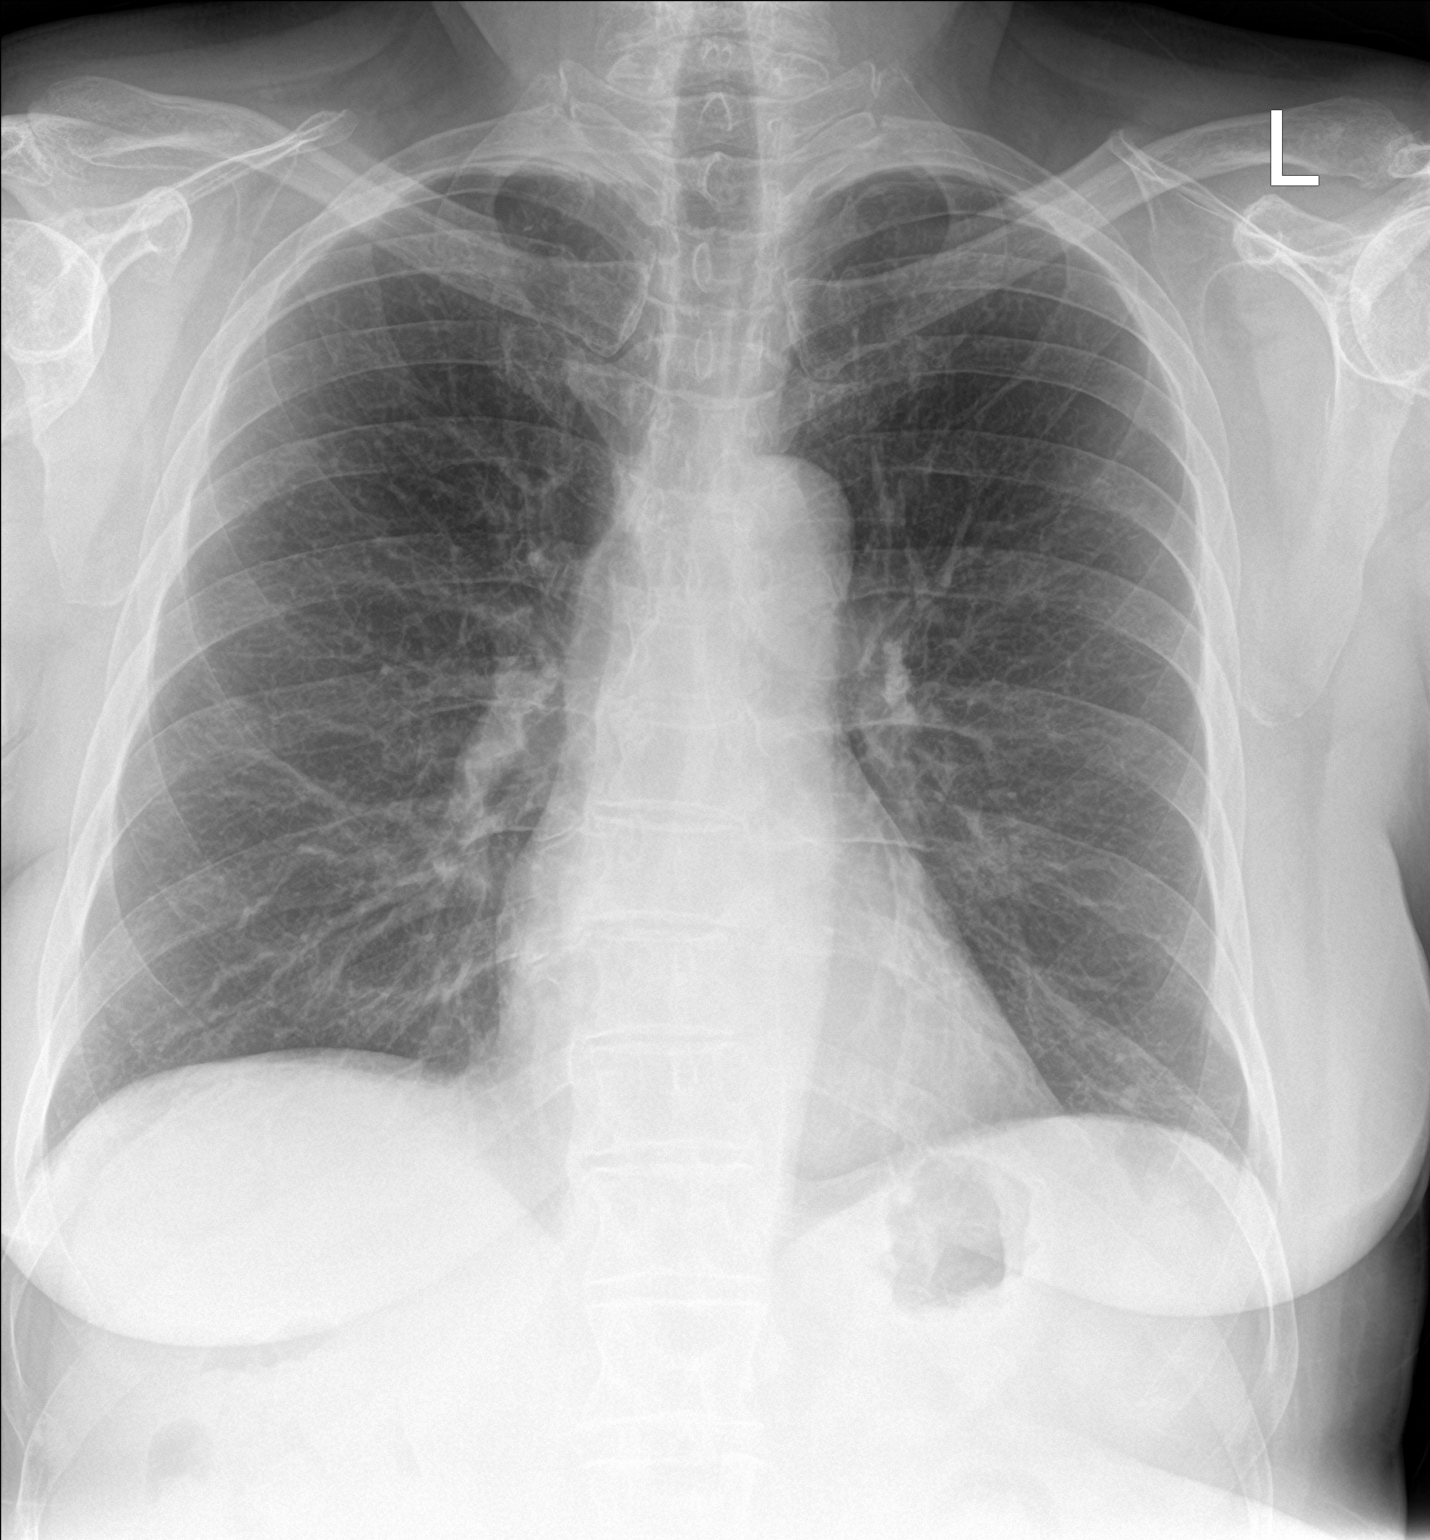

[chest lat]
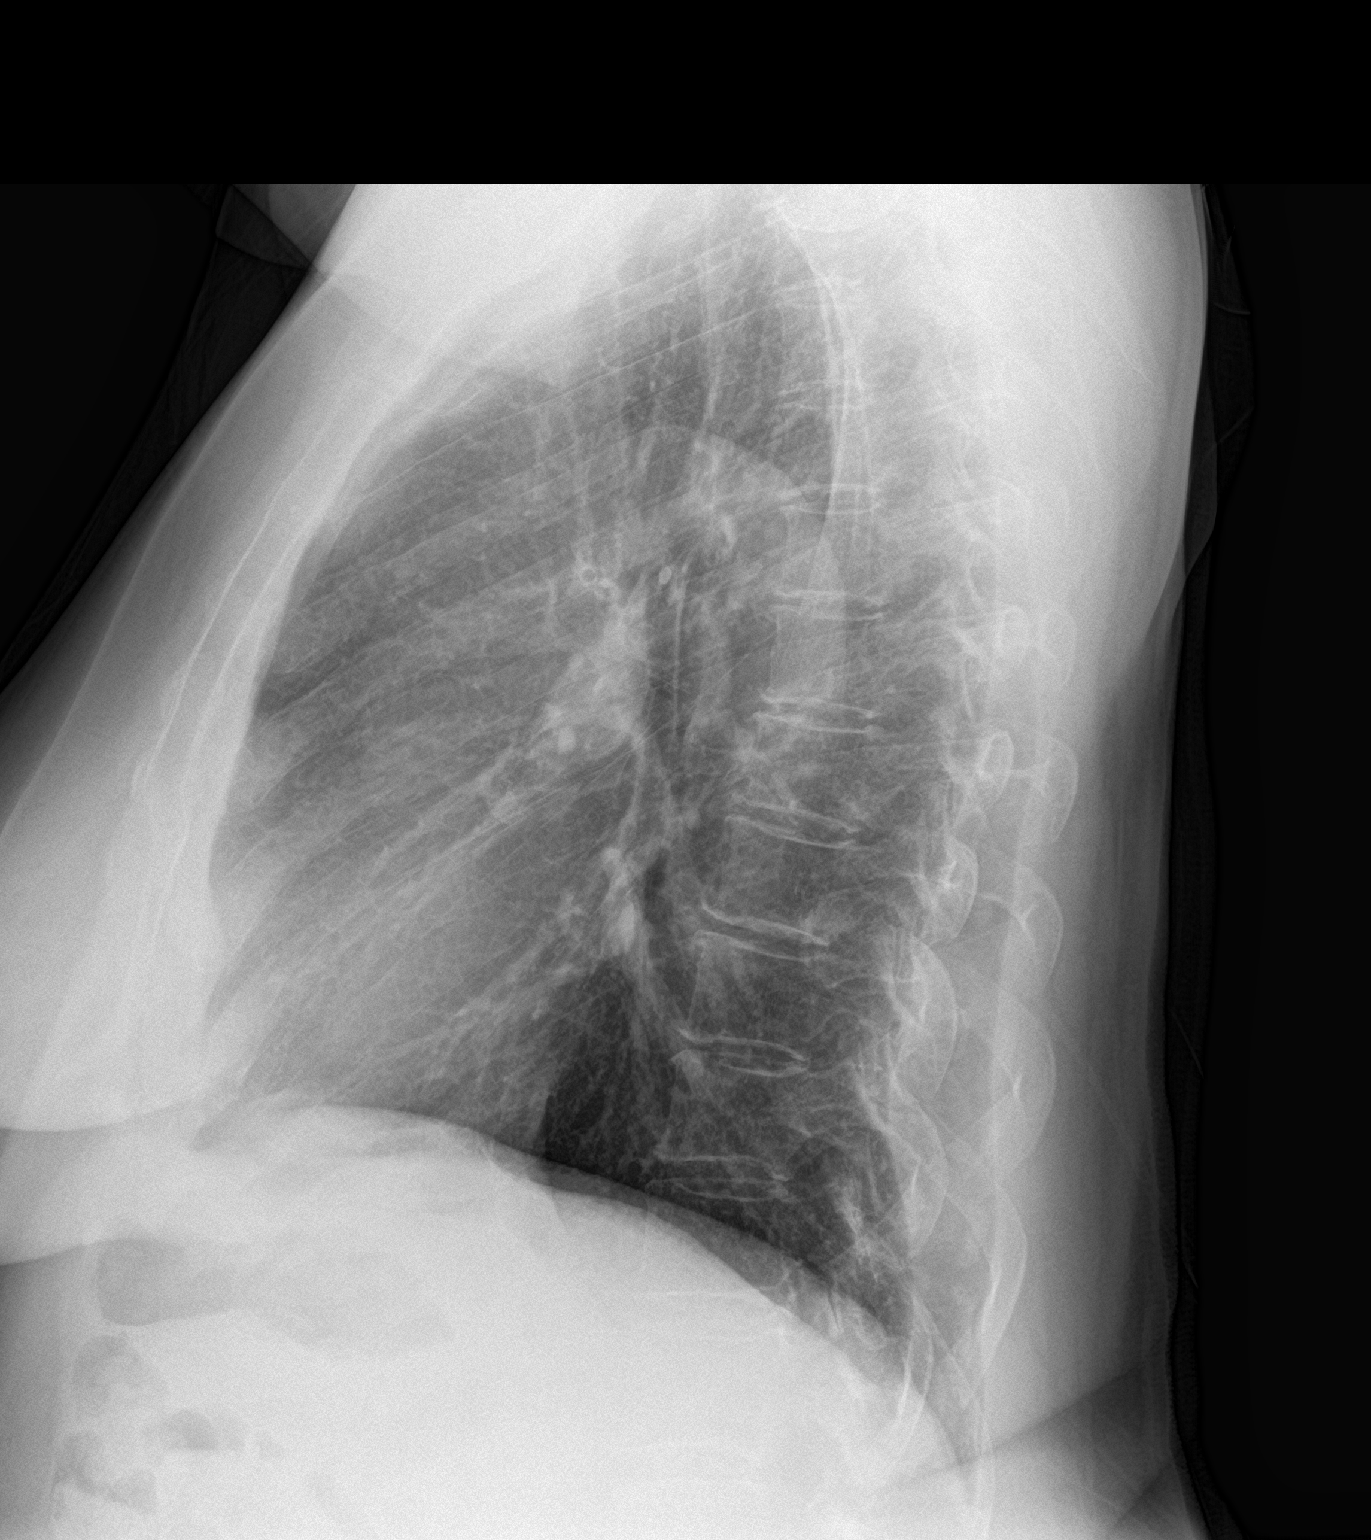

[2 of 2 positions shown; findings below may reference images not displayed]

FINDINGS: The lungs are mildly hyperinflated with hemidiaphragm flattening.
There is no focal infiltrate. There is no pleural effusion. The
heart and pulmonary vascularity are normal. The mediastinum is
normal in width. The trachea is midline. The bony thorax is
unremarkable.
IMPRESSION: Chronic bronchitic changes. There is no active cardiopulmonary
disease.

## 2018-09-27 DIAGNOSIS — Z20828 Contact with and (suspected) exposure to other viral communicable diseases: Secondary | ICD-10-CM | POA: Diagnosis not present

## 2018-12-25 ENCOUNTER — Other Ambulatory Visit: Payer: Self-pay

## 2020-02-26 DIAGNOSIS — R21 Rash and other nonspecific skin eruption: Secondary | ICD-10-CM | POA: Diagnosis not present

## 2021-02-04 DIAGNOSIS — C50911 Malignant neoplasm of unspecified site of right female breast: Secondary | ICD-10-CM | POA: Diagnosis not present

## 2021-02-12 DIAGNOSIS — Z170 Estrogen receptor positive status [ER+]: Secondary | ICD-10-CM | POA: Diagnosis not present

## 2021-02-12 DIAGNOSIS — C44501 Unspecified malignant neoplasm of skin of breast: Secondary | ICD-10-CM | POA: Diagnosis not present

## 2021-02-12 DIAGNOSIS — N6310 Unspecified lump in the right breast, unspecified quadrant: Secondary | ICD-10-CM | POA: Diagnosis not present

## 2021-02-12 DIAGNOSIS — C50911 Malignant neoplasm of unspecified site of right female breast: Secondary | ICD-10-CM | POA: Diagnosis not present

## 2021-02-19 DIAGNOSIS — N6310 Unspecified lump in the right breast, unspecified quadrant: Secondary | ICD-10-CM | POA: Diagnosis not present

## 2021-02-19 DIAGNOSIS — N6311 Unspecified lump in the right breast, upper outer quadrant: Secondary | ICD-10-CM | POA: Diagnosis not present

## 2021-02-19 DIAGNOSIS — R590 Localized enlarged lymph nodes: Secondary | ICD-10-CM | POA: Diagnosis not present

## 2021-02-19 DIAGNOSIS — C44501 Unspecified malignant neoplasm of skin of breast: Secondary | ICD-10-CM | POA: Diagnosis not present

## 2021-02-25 DIAGNOSIS — Z170 Estrogen receptor positive status [ER+]: Secondary | ICD-10-CM | POA: Diagnosis not present

## 2021-02-25 DIAGNOSIS — C50111 Malignant neoplasm of central portion of right female breast: Secondary | ICD-10-CM | POA: Diagnosis not present

## 2022-11-16 DIAGNOSIS — Z880 Allergy status to penicillin: Secondary | ICD-10-CM | POA: Diagnosis not present

## 2022-11-16 DIAGNOSIS — X501XXA Overexertion from prolonged static or awkward postures, initial encounter: Secondary | ICD-10-CM | POA: Diagnosis not present

## 2022-11-16 DIAGNOSIS — S42291A Other displaced fracture of upper end of right humerus, initial encounter for closed fracture: Secondary | ICD-10-CM | POA: Diagnosis not present

## 2022-11-16 DIAGNOSIS — F17210 Nicotine dependence, cigarettes, uncomplicated: Secondary | ICD-10-CM | POA: Diagnosis not present

## 2022-11-16 DIAGNOSIS — M25511 Pain in right shoulder: Secondary | ICD-10-CM | POA: Diagnosis not present

## 2022-11-16 DIAGNOSIS — S42201A Unspecified fracture of upper end of right humerus, initial encounter for closed fracture: Secondary | ICD-10-CM | POA: Diagnosis not present

## 2022-11-16 DIAGNOSIS — I10 Essential (primary) hypertension: Secondary | ICD-10-CM | POA: Diagnosis not present

## 2022-11-16 DIAGNOSIS — Z882 Allergy status to sulfonamides status: Secondary | ICD-10-CM | POA: Diagnosis not present

## 2022-11-29 DIAGNOSIS — S42291A Other displaced fracture of upper end of right humerus, initial encounter for closed fracture: Secondary | ICD-10-CM | POA: Diagnosis not present

## 2022-11-29 DIAGNOSIS — M19011 Primary osteoarthritis, right shoulder: Secondary | ICD-10-CM | POA: Diagnosis not present

## 2022-12-13 DIAGNOSIS — S21001A Unspecified open wound of right breast, initial encounter: Secondary | ICD-10-CM | POA: Diagnosis not present

## 2022-12-13 DIAGNOSIS — S42291D Other displaced fracture of upper end of right humerus, subsequent encounter for fracture with routine healing: Secondary | ICD-10-CM | POA: Diagnosis not present

## 2023-01-07 ENCOUNTER — Telehealth: Payer: Self-pay | Admitting: Internal Medicine

## 2023-01-07 NOTE — Telephone Encounter (Signed)
Pt called wanting a referral sent over to imaging for [place] for her breast advised pt that she has to be seen pt stated she have a broken arm and some other issues. Pt also stated she spoke Dr. Yetta Barre about this a couple of years ago.

## 2023-02-21 DIAGNOSIS — R531 Weakness: Secondary | ICD-10-CM | POA: Diagnosis not present

## 2023-02-21 DIAGNOSIS — D630 Anemia in neoplastic disease: Secondary | ICD-10-CM | POA: Diagnosis not present

## 2023-02-21 DIAGNOSIS — C50911 Malignant neoplasm of unspecified site of right female breast: Secondary | ICD-10-CM | POA: Diagnosis not present

## 2023-02-21 DIAGNOSIS — R2681 Unsteadiness on feet: Secondary | ICD-10-CM | POA: Diagnosis not present

## 2023-02-21 DIAGNOSIS — C7802 Secondary malignant neoplasm of left lung: Secondary | ICD-10-CM | POA: Diagnosis not present

## 2023-02-21 DIAGNOSIS — G9341 Metabolic encephalopathy: Secondary | ICD-10-CM | POA: Diagnosis not present

## 2023-02-21 DIAGNOSIS — R9431 Abnormal electrocardiogram [ECG] [EKG]: Secondary | ICD-10-CM | POA: Diagnosis not present

## 2023-02-21 DIAGNOSIS — M6281 Muscle weakness (generalized): Secondary | ICD-10-CM | POA: Diagnosis not present

## 2023-02-21 DIAGNOSIS — N641 Fat necrosis of breast: Secondary | ICD-10-CM | POA: Diagnosis not present

## 2023-02-21 DIAGNOSIS — D649 Anemia, unspecified: Secondary | ICD-10-CM | POA: Diagnosis not present

## 2023-02-21 DIAGNOSIS — C787 Secondary malignant neoplasm of liver and intrahepatic bile duct: Secondary | ICD-10-CM | POA: Diagnosis not present

## 2023-02-21 DIAGNOSIS — J910 Malignant pleural effusion: Secondary | ICD-10-CM | POA: Diagnosis not present

## 2023-02-21 DIAGNOSIS — R0602 Shortness of breath: Secondary | ICD-10-CM | POA: Diagnosis not present

## 2023-02-21 DIAGNOSIS — Z66 Do not resuscitate: Secondary | ICD-10-CM | POA: Diagnosis not present

## 2023-02-21 DIAGNOSIS — F17200 Nicotine dependence, unspecified, uncomplicated: Secondary | ICD-10-CM | POA: Diagnosis not present

## 2023-02-21 DIAGNOSIS — C7801 Secondary malignant neoplasm of right lung: Secondary | ICD-10-CM | POA: Diagnosis not present

## 2023-02-21 DIAGNOSIS — E876 Hypokalemia: Secondary | ICD-10-CM | POA: Diagnosis not present

## 2023-02-21 DIAGNOSIS — C7951 Secondary malignant neoplasm of bone: Secondary | ICD-10-CM | POA: Diagnosis not present

## 2023-02-21 DIAGNOSIS — I498 Other specified cardiac arrhythmias: Secondary | ICD-10-CM | POA: Diagnosis not present

## 2023-02-22 DIAGNOSIS — C50911 Malignant neoplasm of unspecified site of right female breast: Secondary | ICD-10-CM | POA: Diagnosis not present

## 2023-02-22 DIAGNOSIS — D649 Anemia, unspecified: Secondary | ICD-10-CM | POA: Diagnosis not present

## 2023-02-22 DIAGNOSIS — C7951 Secondary malignant neoplasm of bone: Secondary | ICD-10-CM | POA: Diagnosis not present

## 2023-02-23 DIAGNOSIS — M6281 Muscle weakness (generalized): Secondary | ICD-10-CM | POA: Diagnosis not present

## 2023-02-23 DIAGNOSIS — D649 Anemia, unspecified: Secondary | ICD-10-CM | POA: Diagnosis not present

## 2023-08-23 DEATH — deceased
